# Patient Record
Sex: Female | Born: 1995 | Race: Black or African American | Hispanic: No | Marital: Single | Smoking: Never smoker
Health system: Southern US, Community
[De-identification: ages and names within clinical notes are randomized; demographics above are authoritative.]

---
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
## 2020-09-27 ENCOUNTER — Other Ambulatory Visit: Payer: Self-pay

## 2020-09-27 ENCOUNTER — Emergency Department (HOSPITAL_COMMUNITY): Payer: Medicaid Other

## 2020-09-27 ENCOUNTER — Emergency Department (HOSPITAL_COMMUNITY)
Admission: EM | Admit: 2020-09-27 | Discharge: 2020-09-27 | Disposition: A | Discharge status: Home / Self Care | Payer: Medicaid Other | Attending: Emergency Medicine | Admitting: Emergency Medicine

## 2020-09-27 ENCOUNTER — Encounter (HOSPITAL_COMMUNITY): Payer: Self-pay | Admitting: Emergency Medicine

## 2020-09-27 DIAGNOSIS — R0789 Other chest pain: Secondary | ICD-10-CM | POA: Insufficient documentation

## 2020-09-27 LAB — CBC
HCT: 42.5 % (ref 36.0–46.0)
Hemoglobin: 14.1 g/dL (ref 12.0–15.0)
MCH: 30.1 pg (ref 26.0–34.0)
MCHC: 33.2 g/dL (ref 30.0–36.0)
MCV: 90.8 fL (ref 80.0–100.0)
Platelets: 239 10*3/uL (ref 150–400)
RBC: 4.68 MIL/uL (ref 3.87–5.11)
RDW: 12.6 % (ref 11.5–15.5)
WBC: 11.1 10*3/uL — ABNORMAL HIGH (ref 4.0–10.5)
nRBC: 0 % (ref 0.0–0.2)

## 2020-09-27 LAB — BASIC METABOLIC PANEL
Anion gap: 7 (ref 5–15)
BUN: 8 mg/dL (ref 6–20)
CO2: 22 mmol/L (ref 22–32)
Calcium: 9 mg/dL (ref 8.9–10.3)
Chloride: 107 mmol/L (ref 98–111)
Creatinine, Ser: 0.85 mg/dL (ref 0.44–1.00)
GFR, Estimated: >60 mL/min (ref >60)
Glucose, Bld: 115 mg/dL — ABNORMAL HIGH (ref 70–99)
Potassium: 4 mmol/L (ref 3.5–5.1)
Sodium: 136 mmol/L (ref 135–145)

## 2020-09-27 LAB — I-STAT BETA HCG BLOOD, ED (MC, WL, AP ONLY): I-stat hCG, quantitative: <5 m[IU]/mL (ref <5)

## 2020-09-27 LAB — D-DIMER, QUANTITATIVE (NOT AT ARMC): D-Dimer, Quant: <0.27 ug/mL-FEU (ref 0.00–0.50)

## 2020-09-27 LAB — HEPATIC FUNCTION PANEL
ALT: 16 U/L (ref 0–44)
AST: 15 U/L (ref 15–41)
Albumin: 3.6 g/dL (ref 3.5–5.0)
Alkaline Phosphatase: 59 U/L (ref 38–126)
Bilirubin, Direct: 0.1 mg/dL (ref 0.0–0.2)
Indirect Bilirubin: 0.5 mg/dL (ref 0.3–0.9)
Total Bilirubin: 0.6 mg/dL (ref 0.3–1.2)
Total Protein: 6.8 g/dL (ref 6.5–8.1)

## 2020-09-27 LAB — TROPONIN I (HIGH SENSITIVITY)
Troponin I (High Sensitivity): 2 ng/L (ref <18)
Troponin I (High Sensitivity): 2 ng/L (ref <18)

## 2020-09-27 LAB — LIPASE, BLOOD: Lipase: 25 U/L (ref 11–51)

## 2020-09-27 MED ORDER — ACETAMINOPHEN 325 MG PO TABS
650.0000 mg | ORAL_TABLET | Freq: Once | ORAL | Status: AC
  Administered 2020-09-27: 650 mg via ORAL
  Filled 2020-09-27: qty 2

## 2020-09-27 MED ORDER — SODIUM CHLORIDE 0.9 % IV BOLUS
1000.0000 mL | Freq: Once | INTRAVENOUS | Status: AC
  Administered 2020-09-27: 1000 mL via INTRAVENOUS

## 2020-09-27 NOTE — ED Provider Notes (Signed)
[facility] EMERGENCY DEPARTMENT Provider Note   CSN: [id number] Arrival date & time: 09/27/20  1329     History Chief Complaint  Patient presents with  . Chest Pain    Rhonda Mclaughlin is a 25 y.o. female.  The history is provided by the patient.  Chest Pain Pain location:  Substernal area Pain quality: aching   Pain radiates to:  Does not radiate Pain severity:  Mild Onset quality:  Gradual Timing:  Intermittent Progression:  Waxing and waning Chronicity:  New Context: movement and at rest   Relieved by:  Nothing Worsened by:  Nothing Associated symptoms: no abdominal pain, no back pain, no cough, no fever, no palpitations, no shortness of breath and no vomiting   Risk factors: no coronary artery disease, no high cholesterol, no hypertension and no prior DVT/PE   Risk factors comment:  Covid last month      History reviewed. No pertinent past medical history.  There are no problems to display for this patient.   History reviewed. No pertinent surgical history.   OB History   No obstetric history on file.     No family history on file.  Social History   Tobacco Use  . Smoking status: Never Smoker  . Smokeless tobacco: Never Used  Substance Use Topics  . Alcohol use: No  . Drug use: No    Home Medications Prior to Admission medications   Not on File    Allergies    Patient has no known allergies.  Review of Systems   Review of Systems  Constitutional: Negative for chills and fever.  HENT: Negative for ear pain and sore throat.   Eyes: Negative for pain and visual disturbance.  Respiratory: Negative for cough and shortness of breath.   Cardiovascular: Positive for chest pain. Negative for palpitations.  Gastrointestinal: Negative for abdominal pain and vomiting.  Genitourinary: Negative for dysuria and hematuria.  Musculoskeletal: Negative for arthralgias and back pain.  Skin: Negative for color change and rash.   Neurological: Negative for seizures and syncope.  All other systems reviewed and are negative.   Physical Exam Updated Vital Signs BP 107/75   Pulse (!) 58   Temp 98.5 F (36.9 C) (Oral)   Resp 16   LMP 09/09/2020   SpO2 100%   Physical Exam Vitals and nursing note reviewed.  Constitutional:      General: She is not in acute distress.    Appearance: She is well-developed and well-nourished. She is not ill-appearing.  HENT:     Head: Normocephalic and atraumatic.  Eyes:     Conjunctiva/sclera: Conjunctivae normal.  Cardiovascular:     Rate and Rhythm: Normal rate and regular rhythm.     Pulses:          Radial pulses are 2+ on the right side and 2+ on the left side.     Heart sounds: Normal heart sounds. No murmur heard.   Pulmonary:     Effort: Pulmonary effort is normal. No respiratory distress.     Breath sounds: Normal breath sounds. No decreased breath sounds.  Chest:     Chest wall: Tenderness present.  Abdominal:     Palpations: Abdomen is soft.     Tenderness: There is no abdominal tenderness.  Musculoskeletal:        General: No edema.     Cervical back: Neck supple.     Right lower leg: No edema.     Left lower leg: No  edema.  Skin:    General: Skin is warm and dry.     Capillary Refill: Capillary refill takes less than 2 seconds.  Neurological:     General: No focal deficit present.     Mental Status: She is alert.  Psychiatric:        Mood and Affect: Mood and affect normal.     ED Results / Procedures / Treatments   Labs (all labs ordered are listed, but only abnormal results are displayed) Labs Reviewed  BASIC METABOLIC PANEL - Abnormal; Notable for the following components:      Result Value   Glucose, Bld 115 (*)    All other components within normal limits  CBC - Abnormal; Notable for the following components:   WBC 11.1 (*)    All other components within normal limits  D-DIMER, QUANTITATIVE (NOT AT [facility])  LIPASE, BLOOD  HEPATIC  FUNCTION PANEL  I-STAT BETA HCG BLOOD, ED ([facility], [facility], [facility] ONLY)  TROPONIN I (HIGH SENSITIVITY)  TROPONIN I (HIGH SENSITIVITY)    EKG EKG Interpretation  Date/Time:  Saturday September 27 2020 14:43:41 EST Ventricular Rate:  75 PR Interval:  130 QRS Duration: 82 QT Interval:  366 QTC Calculation: 408 R Axis:   84 Text Interpretation: Normal sinus rhythm with sinus arrhythmia Biatrial enlargement Confirmed by Virgina Norfolk [phone]) on 09/27/2020 5:46:26 PM   Radiology DG Chest 2 View  Result Date: 09/27/2020 CLINICAL DATA:  Chest pain and shortness of breath. EXAM: CHEST - 2 VIEW COMPARISON:  None. FINDINGS: The heart size and mediastinal contours are within normal limits. Both lungs are clear. The visualized skeletal structures are unremarkable. Negative for a pneumothorax. IMPRESSION: No active cardiopulmonary disease. Electronically Signed   By: Richarda Overlie M.D.   On: 09/27/2020 14:25    Procedures Procedures (including critical care time)  Medications Ordered in ED Medications  sodium chloride 0.9 % bolus 1,000 mL (1,000 mLs Intravenous New Bag/Given 09/27/20 1827)  acetaminophen (TYLENOL) tablet 650 mg (650 mg Oral Given 09/27/20 1827)    ED Course  I have reviewed the triage vital signs and the nursing notes.  Pertinent labs & imaging results that were available during my care of the patient were reviewed by me and considered in my medical decision making (see chart for details).    MDM Rules/Calculators/A&P                          Rhonda Mclaughlin is a 25 year old female with no significant medical history presents to the ED with chest pain.  Normal vitals.  No fever.  COVID last month.  Chest x-ray without signs of infection.  No pneumothorax, no pneumonia.  Troponin is normal.  EKG shows sinus rhythm.  Doubt ACS.  No significant anemia, electrolyte abnormality, kidney injury otherwise.  Pregnancy test is negative.  Suspect musculoskeletal type pain as she does have some  reproducible pain on exam.  However given COVID last month we will check a D-dimer and consider CT scan to rule out PE.  Will give Tylenol.  Repeat troponin normal. D-dimer negative and doubt PE. Overall suspect musculoskeletal pain. Discharged from the ED in good condition.  This chart was dictated using voice recognition software.  Despite best efforts to proofread,  errors can occur which can change the documentation meaning.     Final Clinical Impression(s) / ED Diagnoses Final diagnoses:  Atypical chest pain    Rx / DC Orders ED Discharge Orders  None       Virgina Norfolk, DO 09/27/20 1945

## 2020-09-27 NOTE — ED Triage Notes (Signed)
Pt reports intermittent pain across chest and several syncopal episodes at work over the last month.  Reports SOB, nausea, vomiting, and diarrhea.

## 2021-05-21 IMAGING — CR DG CHEST 2V
2 series · 2 of 2 positions shown · non-contrast
Comparison: None.

CLINICAL DATA: Chest pain and shortness of breath.

EXAM:
CHEST - 2 VIEW

[chest pa]
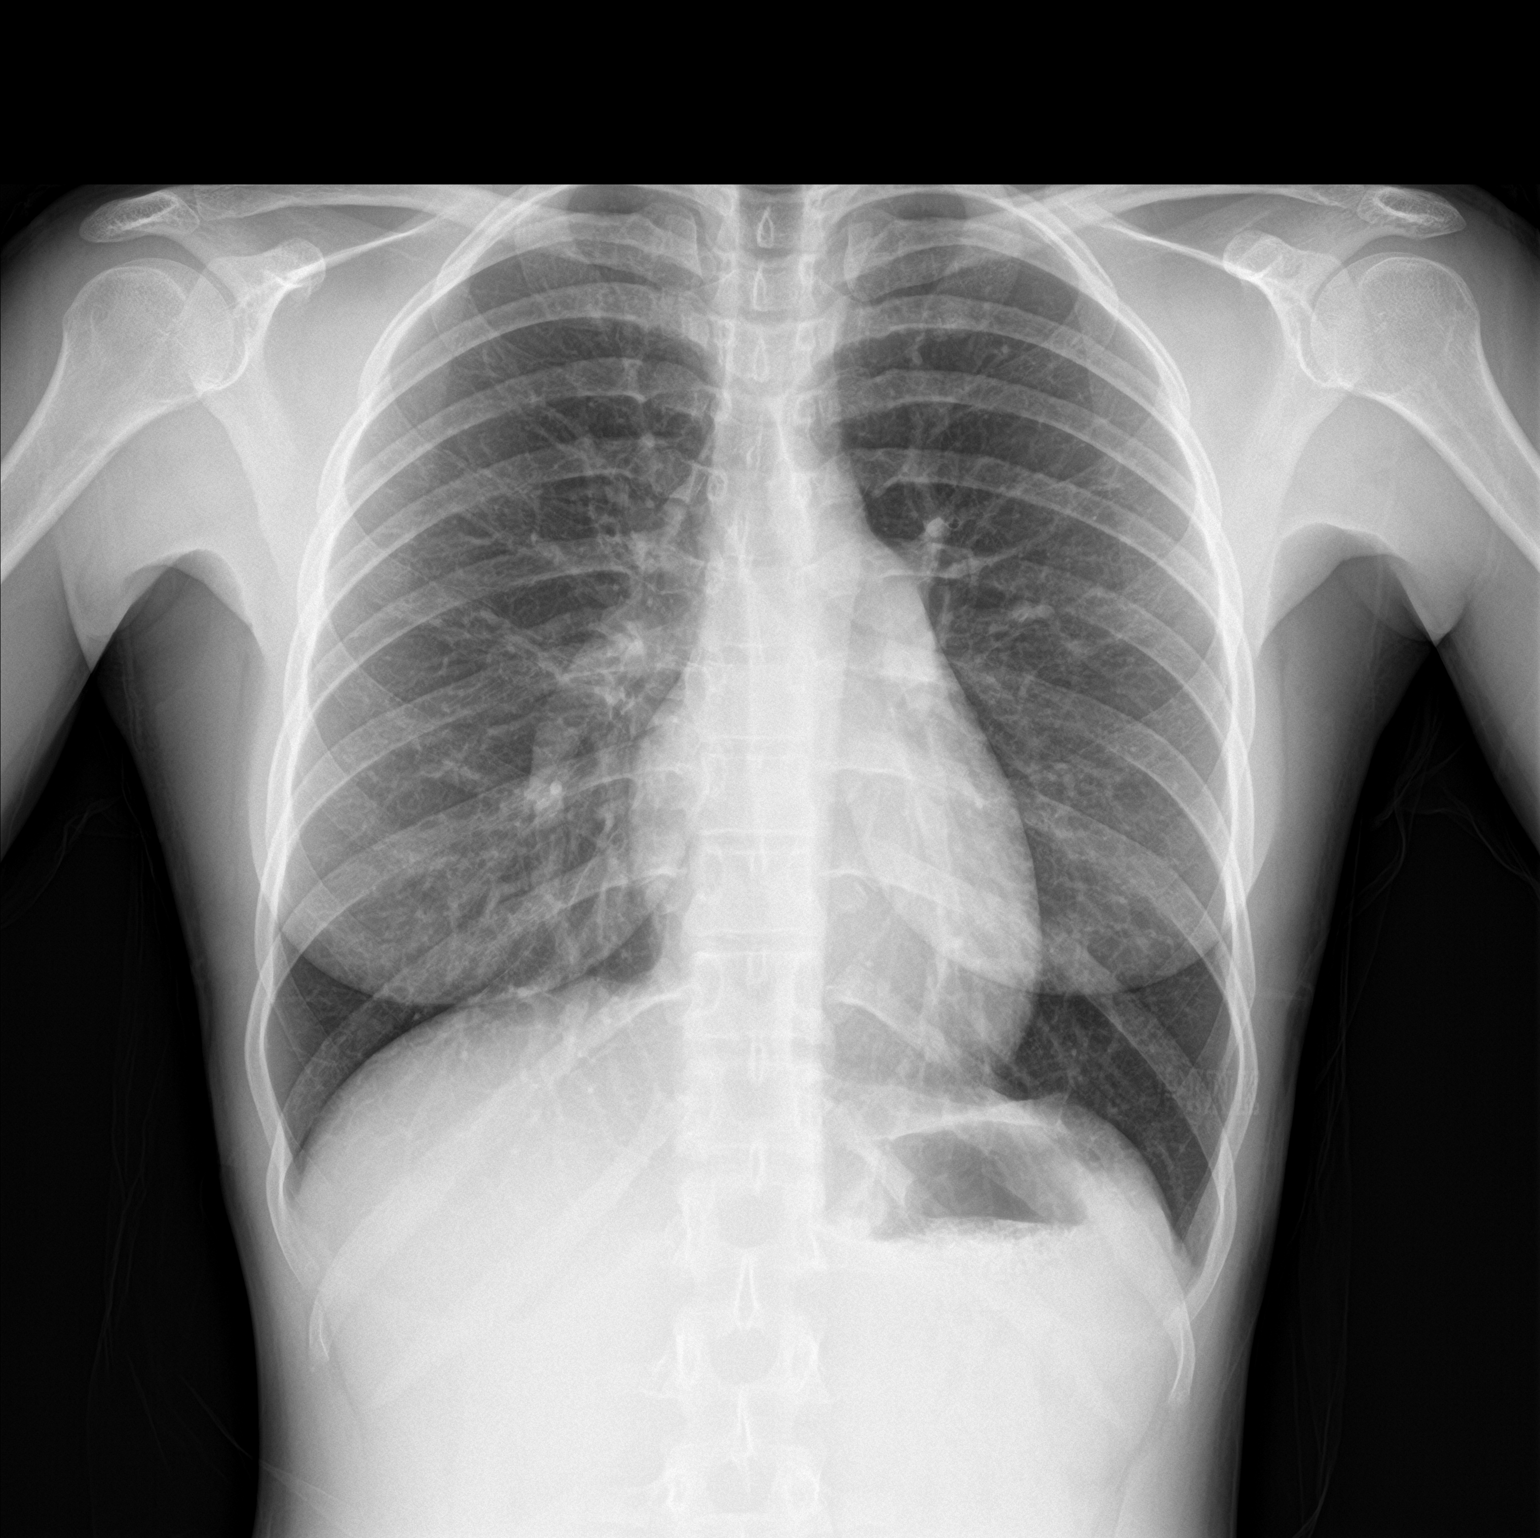

[chest lat]
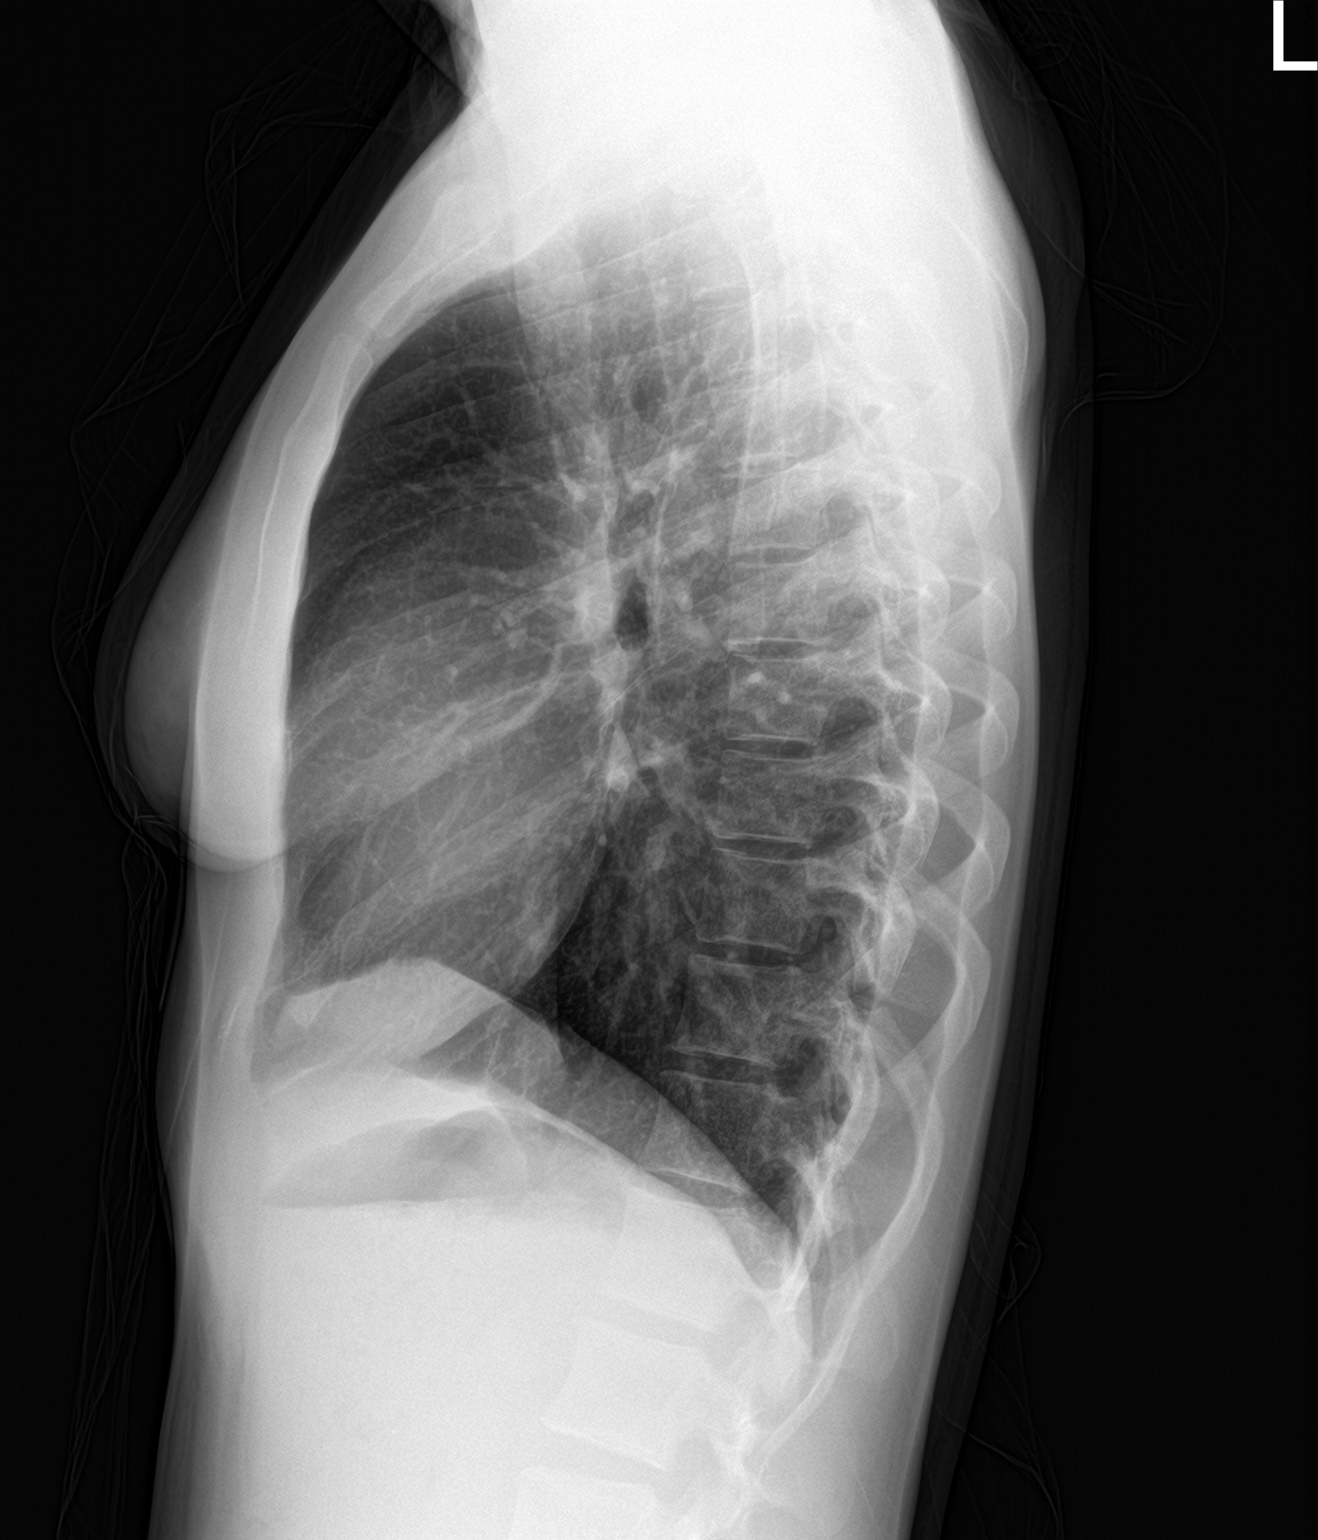

[2 of 2 positions shown; findings below may reference images not displayed]

FINDINGS: The heart size and mediastinal contours are within normal limits.
Both lungs are clear. The visualized skeletal structures are
unremarkable. Negative for a pneumothorax.
IMPRESSION: No active cardiopulmonary disease.
